# Patient Record
Sex: Female | Born: 1935 | Race: White | Hispanic: No
Health system: Southern US, Community
[De-identification: ages and names within clinical notes are randomized; demographics above are authoritative.]

---

## 2006-05-10 ENCOUNTER — Emergency Department: Payer: Self-pay | Admitting: Emergency Medicine

## 2006-11-12 ENCOUNTER — Ambulatory Visit: Payer: Self-pay | Admitting: Internal Medicine

## 2006-11-13 ENCOUNTER — Ambulatory Visit: Payer: Self-pay | Admitting: Internal Medicine

## 2006-11-25 ENCOUNTER — Ambulatory Visit: Payer: Self-pay | Admitting: Internal Medicine

## 2007-07-24 ENCOUNTER — Other Ambulatory Visit: Payer: Self-pay

## 2007-07-25 ENCOUNTER — Inpatient Hospital Stay: Payer: Self-pay | Admitting: Internal Medicine

## 2007-08-27 ENCOUNTER — Inpatient Hospital Stay: Payer: Self-pay | Admitting: Specialist

## 2007-08-27 ENCOUNTER — Other Ambulatory Visit: Payer: Self-pay

## 2007-12-10 ENCOUNTER — Other Ambulatory Visit: Payer: Self-pay

## 2007-12-10 ENCOUNTER — Inpatient Hospital Stay: Payer: Self-pay | Admitting: Internal Medicine

## 2009-04-11 ENCOUNTER — Inpatient Hospital Stay: Payer: Self-pay | Admitting: Internal Medicine

## 2009-10-07 ENCOUNTER — Inpatient Hospital Stay: Payer: Self-pay | Admitting: Internal Medicine

## 2010-03-12 ENCOUNTER — Emergency Department: Payer: Self-pay | Admitting: Emergency Medicine

## 2010-03-18 ENCOUNTER — Emergency Department: Payer: Self-pay | Admitting: Emergency Medicine

## 2011-04-02 ENCOUNTER — Emergency Department: Payer: Self-pay | Admitting: Emergency Medicine

## 2012-02-22 ENCOUNTER — Ambulatory Visit: Payer: Self-pay | Admitting: Internal Medicine

## 2012-03-13 LAB — CBC WITH DIFFERENTIAL/PLATELET
Basophil #: 0 10*3/uL (ref 0.0–0.1)
Basophil %: 0.5 %
Eosinophil %: 5 %
HCT: 32.9 % — ABNORMAL LOW (ref 35.0–47.0)
HGB: 10.9 g/dL — ABNORMAL LOW (ref 12.0–16.0)
Lymphocyte #: 2.1 10*3/uL (ref 1.0–3.6)
Lymphocyte %: 26.5 %
MCH: 29.8 pg (ref 26.0–34.0)
MCV: 90 fL (ref 80–100)
Monocyte %: 6.5 %
Neutrophil #: 4.8 10*3/uL (ref 1.4–6.5)
Platelet: 259 10*3/uL (ref 150–440)
RBC: 3.66 10*6/uL — ABNORMAL LOW (ref 3.80–5.20)
RDW: 15.4 % — ABNORMAL HIGH (ref 11.5–14.5)
WBC: 7.8 10*3/uL (ref 3.6–11.0)

## 2012-03-13 LAB — URINALYSIS, COMPLETE
Bacteria: NONE SEEN
Bilirubin,UR: NEGATIVE
Glucose,UR: NEGATIVE mg/dL (ref 0–75)
Hyaline Cast: 4
Ketone: NEGATIVE
RBC,UR: <1 /HPF (ref 0–5)
Squamous Epithelial: <1
WBC UR: 1 /HPF (ref 0–5)

## 2012-03-13 LAB — CK TOTAL AND CKMB (NOT AT ARMC)
CK, Total: 53 U/L (ref 21–215)
CK-MB: 1.4 ng/mL (ref 0.5–3.6)

## 2012-03-13 LAB — COMPREHENSIVE METABOLIC PANEL
Albumin: 4 g/dL (ref 3.4–5.0)
Alkaline Phosphatase: 79 U/L (ref 50–136)
Anion Gap: 7 (ref 7–16)
Bilirubin,Total: 0.2 mg/dL (ref 0.2–1.0)
Calcium, Total: 9.1 mg/dL (ref 8.5–10.1)
Creatinine: 1.08 mg/dL (ref 0.60–1.30)
EGFR (Non-African Amer.): 44 — ABNORMAL LOW
Glucose: 166 mg/dL — ABNORMAL HIGH (ref 65–99)
Osmolality: 272 (ref 275–301)
Potassium: 4.6 mmol/L (ref 3.5–5.1)
SGOT(AST): 22 U/L (ref 15–37)
Sodium: 133 mmol/L — ABNORMAL LOW (ref 136–145)

## 2012-03-13 LAB — TROPONIN I: Troponin-I: <0.02 ng/mL

## 2012-03-14 ENCOUNTER — Inpatient Hospital Stay: Payer: Self-pay | Admitting: Family Medicine

## 2012-03-14 LAB — DRUG SCREEN, URINE
Amphetamines, Ur Screen: NEGATIVE (ref ?–1000)
Barbiturates, Ur Screen: NEGATIVE (ref ?–200)
Benzodiazepine, Ur Scrn: NEGATIVE (ref ?–200)
MDMA (Ecstasy)Ur Screen: NEGATIVE (ref ?–500)
Phencyclidine (PCP) Ur S: NEGATIVE (ref ?–25)
Tricyclic, Ur Screen: NEGATIVE (ref ?–1000)

## 2012-03-14 LAB — LIPID PANEL
Cholesterol: 143 mg/dL (ref 0–200)
HDL Cholesterol: 44 mg/dL (ref 40–60)
Triglycerides: 74 mg/dL (ref 0–200)

## 2012-03-14 LAB — AMMONIA: Ammonia, Plasma: <25 mcmol/L (ref 11–32)

## 2012-03-14 LAB — CK TOTAL AND CKMB (NOT AT ARMC): CK, Total: 41 U/L (ref 21–215)

## 2012-03-14 LAB — TROPONIN I: Troponin-I: <0.02 ng/mL

## 2012-03-15 LAB — BASIC METABOLIC PANEL
Anion Gap: 6 — ABNORMAL LOW (ref 7–16)
BUN: 13 mg/dL (ref 7–18)
Chloride: 108 mmol/L — ABNORMAL HIGH (ref 98–107)
Co2: 25 mmol/L (ref 21–32)
Creatinine: 0.89 mg/dL (ref 0.60–1.30)
EGFR (African American): >60
Potassium: 4.1 mmol/L (ref 3.5–5.1)

## 2012-03-15 LAB — CBC WITH DIFFERENTIAL/PLATELET
Eosinophil #: 0.1 10*3/uL (ref 0.0–0.7)
Eosinophil %: 1.8 %
HCT: 28.7 % — ABNORMAL LOW (ref 35.0–47.0)
Lymphocyte #: 1.4 10*3/uL (ref 1.0–3.6)
MCH: 29.6 pg (ref 26.0–34.0)
MCHC: 33.4 g/dL (ref 32.0–36.0)
MCV: 89 fL (ref 80–100)
Monocyte #: 0.5 x10 3/mm (ref 0.2–0.9)
Neutrophil %: 70.8 %
Platelet: 225 10*3/uL (ref 150–440)
RDW: 15 % — ABNORMAL HIGH (ref 11.5–14.5)
WBC: 6.8 10*3/uL (ref 3.6–11.0)

## 2012-03-17 LAB — CBC WITH DIFFERENTIAL/PLATELET
Basophil %: 0.3 %
Eosinophil #: 0.2 10*3/uL (ref 0.0–0.7)
Eosinophil %: 3.1 %
HCT: 29.9 % — ABNORMAL LOW (ref 35.0–47.0)
Lymphocyte #: 1.5 10*3/uL (ref 1.0–3.6)
Lymphocyte %: 19 %
MCH: 31.1 pg (ref 26.0–34.0)
MCV: 89 fL (ref 80–100)
Monocyte %: 9.9 %
Platelet: 249 10*3/uL (ref 150–440)
RBC: 3.37 10*6/uL — ABNORMAL LOW (ref 3.80–5.20)
WBC: 7.9 10*3/uL (ref 3.6–11.0)

## 2012-03-17 LAB — BASIC METABOLIC PANEL
Anion Gap: 8 (ref 7–16)
Calcium, Total: 8.8 mg/dL (ref 8.5–10.1)
Chloride: 109 mmol/L — ABNORMAL HIGH (ref 98–107)
Co2: 25 mmol/L (ref 21–32)
Creatinine: 0.76 mg/dL (ref 0.60–1.30)
EGFR (African American): >60
Potassium: 3.1 mmol/L — ABNORMAL LOW (ref 3.5–5.1)

## 2012-03-19 LAB — CULTURE, BLOOD (SINGLE)

## 2012-03-21 LAB — CREATININE, SERUM
Creatinine: 0.72 mg/dL (ref 0.60–1.30)
EGFR (African American): >60
EGFR (Non-African Amer.): >60

## 2012-03-23 ENCOUNTER — Ambulatory Visit: Payer: Self-pay | Admitting: Internal Medicine

## 2013-01-06 ENCOUNTER — Inpatient Hospital Stay: Payer: Self-pay | Admitting: Internal Medicine

## 2013-01-06 LAB — CBC WITH DIFFERENTIAL/PLATELET
Basophil #: 0 10*3/uL (ref 0.0–0.1)
Eosinophil #: 0.2 10*3/uL (ref 0.0–0.7)
HCT: 33.6 % — ABNORMAL LOW (ref 35.0–47.0)
Lymphocyte #: 1.3 10*3/uL (ref 1.0–3.6)
Lymphocyte %: 21 %
MCHC: 33.6 g/dL (ref 32.0–36.0)
MCV: 90 fL (ref 80–100)
Monocyte #: 0.6 x10 3/mm (ref 0.2–0.9)
Neutrophil #: 4 10*3/uL (ref 1.4–6.5)
Platelet: 191 10*3/uL (ref 150–440)
RDW: 14.6 % — ABNORMAL HIGH (ref 11.5–14.5)

## 2013-01-06 LAB — URINALYSIS, COMPLETE
Glucose,UR: NEGATIVE mg/dL (ref 0–75)
Hyaline Cast: 7
Ketone: NEGATIVE
Nitrite: NEGATIVE
Ph: 6 (ref 4.5–8.0)
Protein: NEGATIVE
Specific Gravity: 1.01 (ref 1.003–1.030)
Squamous Epithelial: 3
WBC UR: 25 /HPF (ref 0–5)

## 2013-01-06 LAB — PROTIME-INR
INR: 1
Prothrombin Time: 13.7 secs (ref 11.5–14.7)

## 2013-01-06 LAB — COMPREHENSIVE METABOLIC PANEL
Albumin: 3 g/dL — ABNORMAL LOW (ref 3.4–5.0)
Alkaline Phosphatase: 78 U/L (ref 50–136)
Anion Gap: 6 — ABNORMAL LOW (ref 7–16)
BUN: 39 mg/dL — ABNORMAL HIGH (ref 7–18)
Calcium, Total: 8.9 mg/dL (ref 8.5–10.1)
Chloride: 106 mmol/L (ref 98–107)
EGFR (Non-African Amer.): 28 — ABNORMAL LOW
Glucose: 119 mg/dL — ABNORMAL HIGH (ref 65–99)
Osmolality: 288 (ref 275–301)
Potassium: 4.4 mmol/L (ref 3.5–5.1)
SGOT(AST): 23 U/L (ref 15–37)
SGPT (ALT): 21 U/L (ref 12–78)
Sodium: 139 mmol/L (ref 136–145)
Total Protein: 6.1 g/dL — ABNORMAL LOW (ref 6.4–8.2)

## 2013-01-08 LAB — BASIC METABOLIC PANEL
BUN: 25 mg/dL — ABNORMAL HIGH (ref 7–18)
Calcium, Total: 8.8 mg/dL (ref 8.5–10.1)
Chloride: 107 mmol/L (ref 98–107)
Creatinine: 0.96 mg/dL (ref 0.60–1.30)
EGFR (African American): 58 — ABNORMAL LOW
EGFR (Non-African Amer.): 50 — ABNORMAL LOW
Glucose: 122 mg/dL — ABNORMAL HIGH (ref 65–99)
Osmolality: 281 (ref 275–301)
Potassium: 3.8 mmol/L (ref 3.5–5.1)
Sodium: 138 mmol/L (ref 136–145)

## 2013-01-08 LAB — CBC WITH DIFFERENTIAL/PLATELET
Basophil #: 0 10*3/uL (ref 0.0–0.1)
Basophil %: 0.4 %
Eosinophil %: 5.8 %
HGB: 10.1 g/dL — ABNORMAL LOW (ref 12.0–16.0)
Lymphocyte #: 1.6 10*3/uL (ref 1.0–3.6)
Lymphocyte %: 26 %
MCHC: 34.5 g/dL (ref 32.0–36.0)
MCV: 89 fL (ref 80–100)
Monocyte #: 0.5 x10 3/mm (ref 0.2–0.9)
Neutrophil #: 3.7 10*3/uL (ref 1.4–6.5)
Platelet: 191 10*3/uL (ref 150–440)
RBC: 3.27 10*6/uL — ABNORMAL LOW (ref 3.80–5.20)
RDW: 13.3 % (ref 11.5–14.5)

## 2013-01-09 LAB — BASIC METABOLIC PANEL
BUN: 11 mg/dL (ref 7–18)
Chloride: 108 mmol/L — ABNORMAL HIGH (ref 98–107)
Co2: 25 mmol/L (ref 21–32)
Creatinine: 0.9 mg/dL (ref 0.60–1.30)
EGFR (African American): >60
EGFR (Non-African Amer.): 54 — ABNORMAL LOW
Glucose: 139 mg/dL — ABNORMAL HIGH (ref 65–99)
Potassium: 3.5 mmol/L (ref 3.5–5.1)
Sodium: 141 mmol/L (ref 136–145)

## 2013-03-23 DEATH — deceased

## 2013-10-09 IMAGING — CT CT HEAD WITHOUT CONTRAST
2 series · 16 of 30 positions shown, 20 images · non-contrast
Comparison: none

REASON FOR EXAM: unresponsive
COMMENTS:

PROCEDURE:     CT  - CT HEAD WITHOUT CONTRAST  - March 13, 2012 [DATE]
RESULT:     Comparison:  None
TECHNIQUE: Multiple axial images from the foramen magnum to the vertex were
obtained without IV contrast.

[Series 2: without · axial · non-contrast · 0.43mm/px · z∈[+16,+146]mm · 13 of 32 slices shown, 17 images]
[im 3/32  brain]
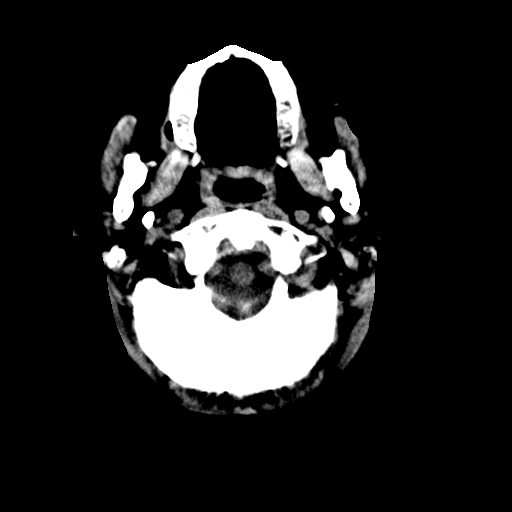
[im 3/32  bone]
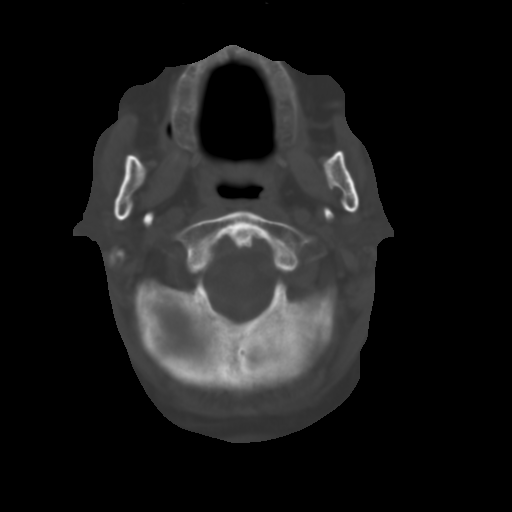
[im 5/32  brain]
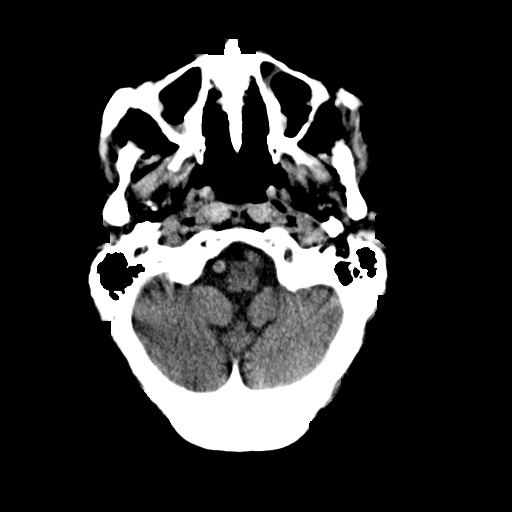
[im 7/32  brain]
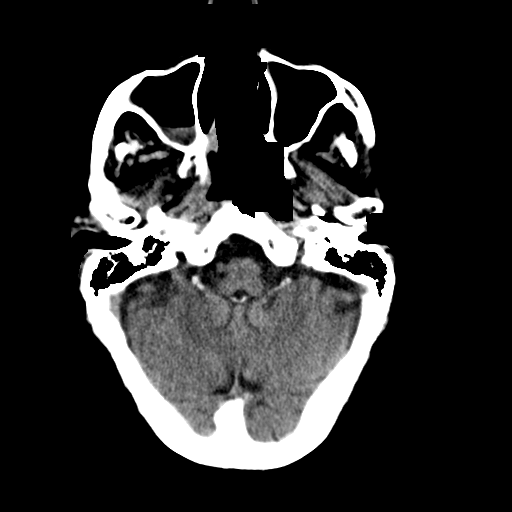
[im 9/32  brain]
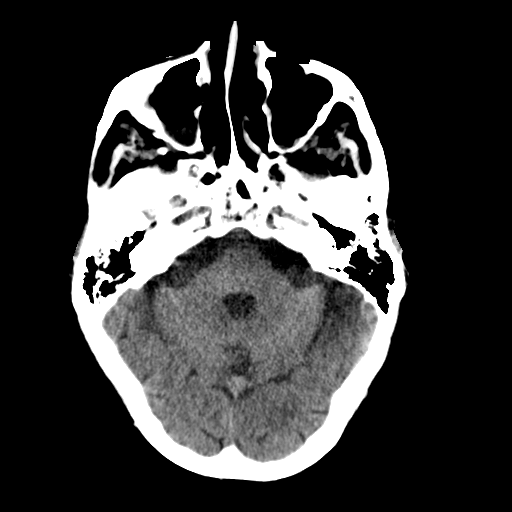
[im 12/32  brain]
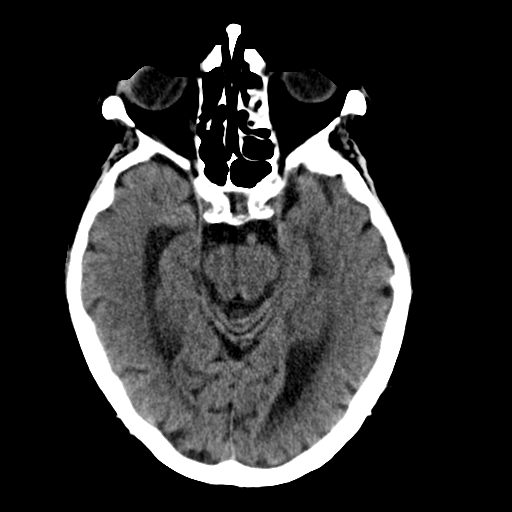
[im 12/32  bone]
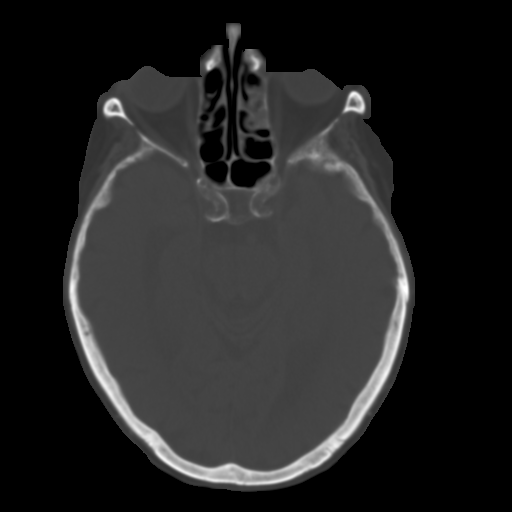
[im 14/32  brain]
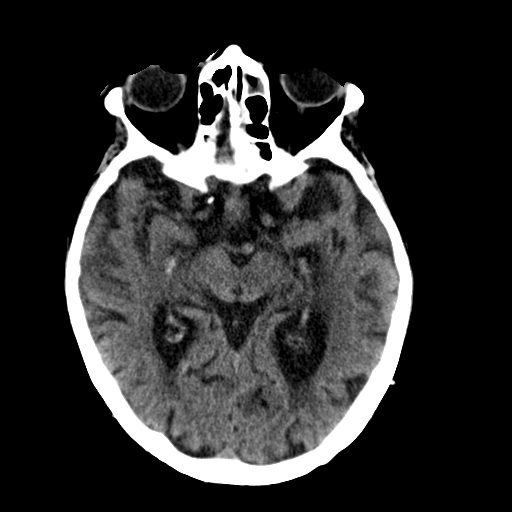
[im 16/32  brain]
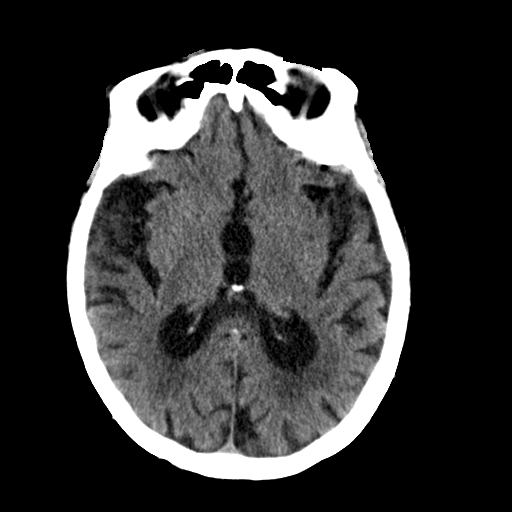
[im 18/32  brain]
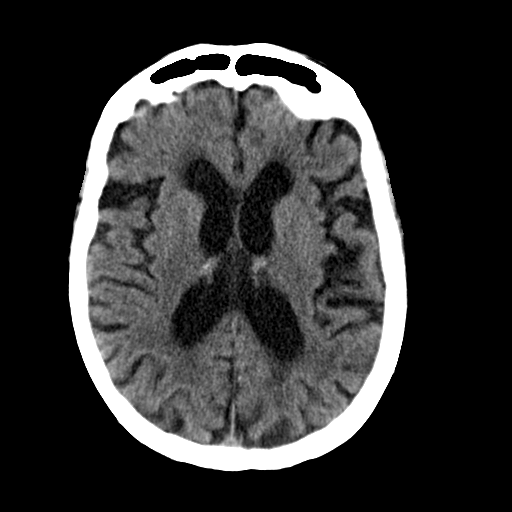
[im 20/32  brain]
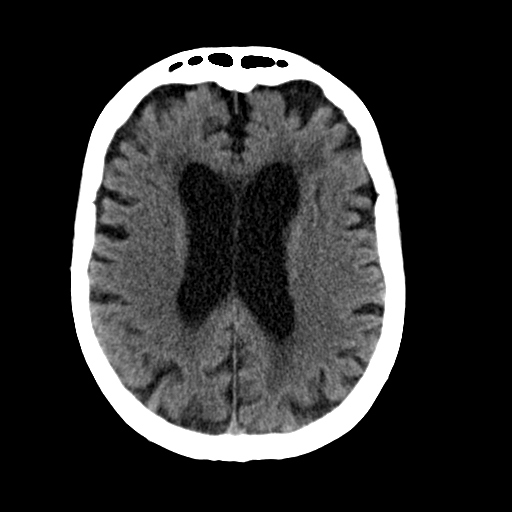
[im 20/32  bone]
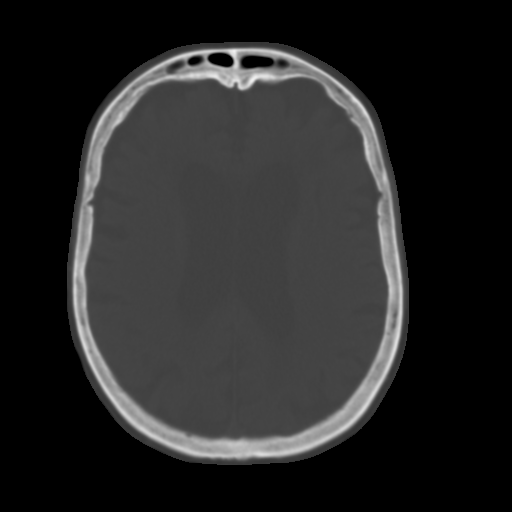
[im 23/32  brain]
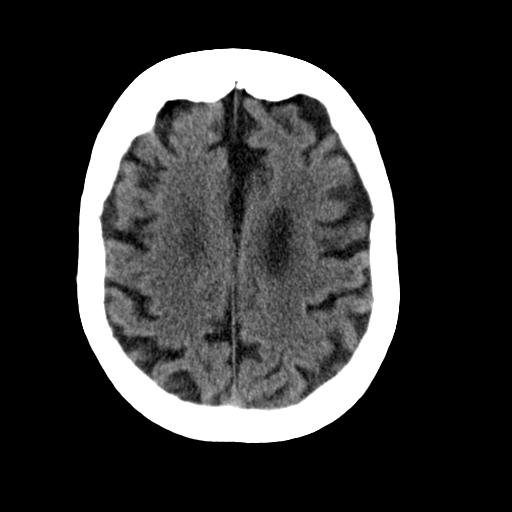
[im 25/32  brain]
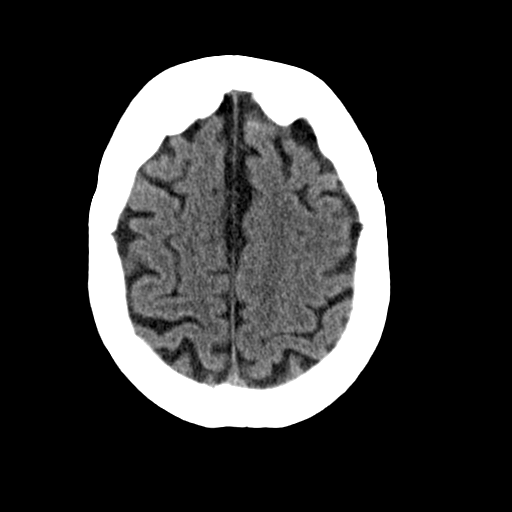
[im 27/32  brain]
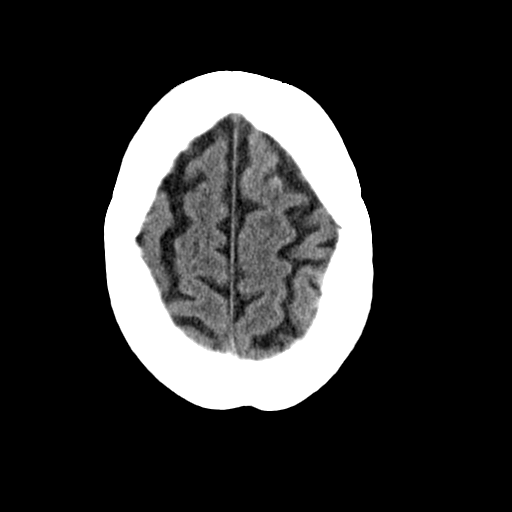
[im 29/32  brain]
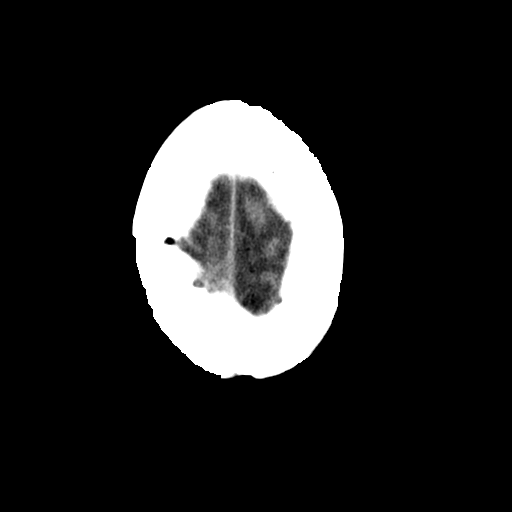
[im 29/32  bone]
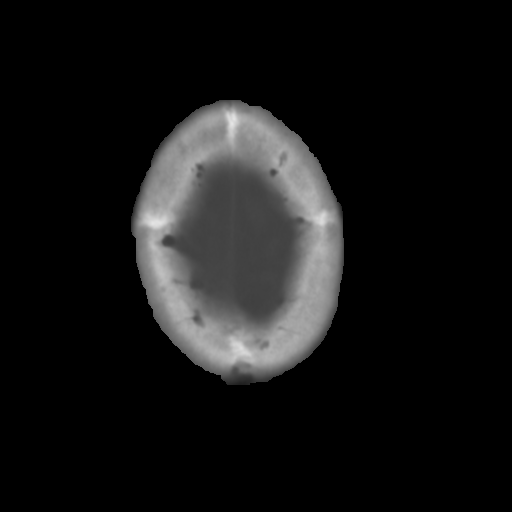

[Series 3: bone · axial · 0.43mm/px · z∈[+16,+61]mm · 3 of 32 slices shown]
[im 3/32  bone]
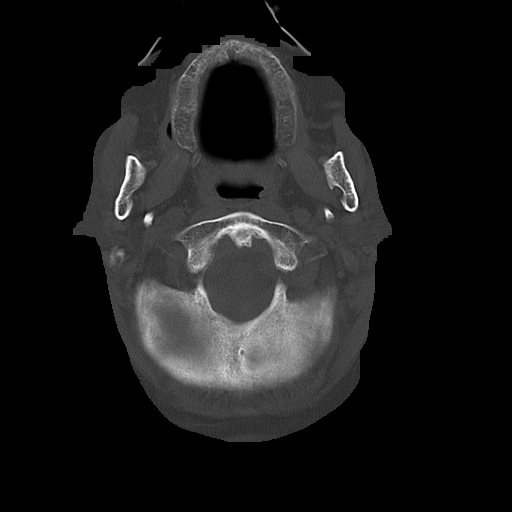
[im 7/32  bone]
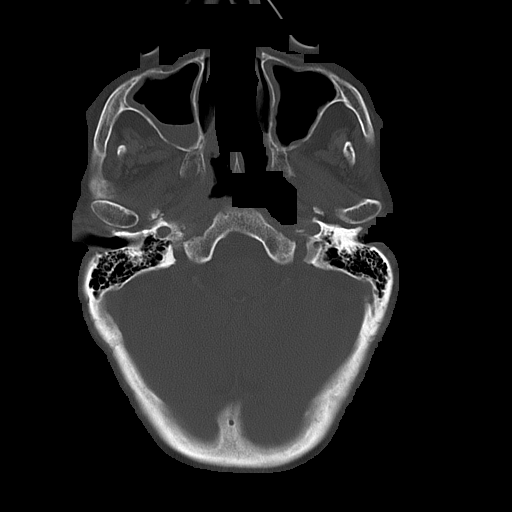
[im 12/32  bone]
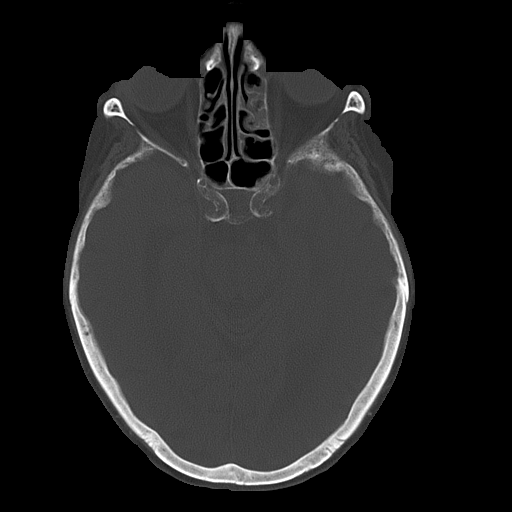

[16 of 30 positions shown; findings below may reference images not displayed]

FINDINGS: There is no evidence for mass effect, midline shift, or extra-axial fluid
collections. There is no evidence for space-occupying lesion, intracranial
hemorrhage, or cortical-based area of infarction. Periventricular and
subcortical hypoattenuation is consistent with chronic small vessel ischemic
disease. There is a small air-fluid level in right maxillary sinus. There is
mild mucous thickening of the left maxillary sinus and the ethmoid air cells.

The osseous structures are unremarkable.
IMPRESSION: 1. No acute intracranial process.
2. Chronic small vessel ischemic disease.
3. Paranasal sinus disease.

## 2013-10-09 IMAGING — CR DG CHEST 1V PORT
1 series · 1 of 1 positions shown · non-contrast
Comparison: none

REASON FOR EXAM: unresponsive
COMMENTS:

PROCEDURE:     DXR - DXR PORTABLE CHEST SINGLE VIEW  - March 13, 2012  [DATE]
RESULT:     Comparison: 04/02/2011

[ap]
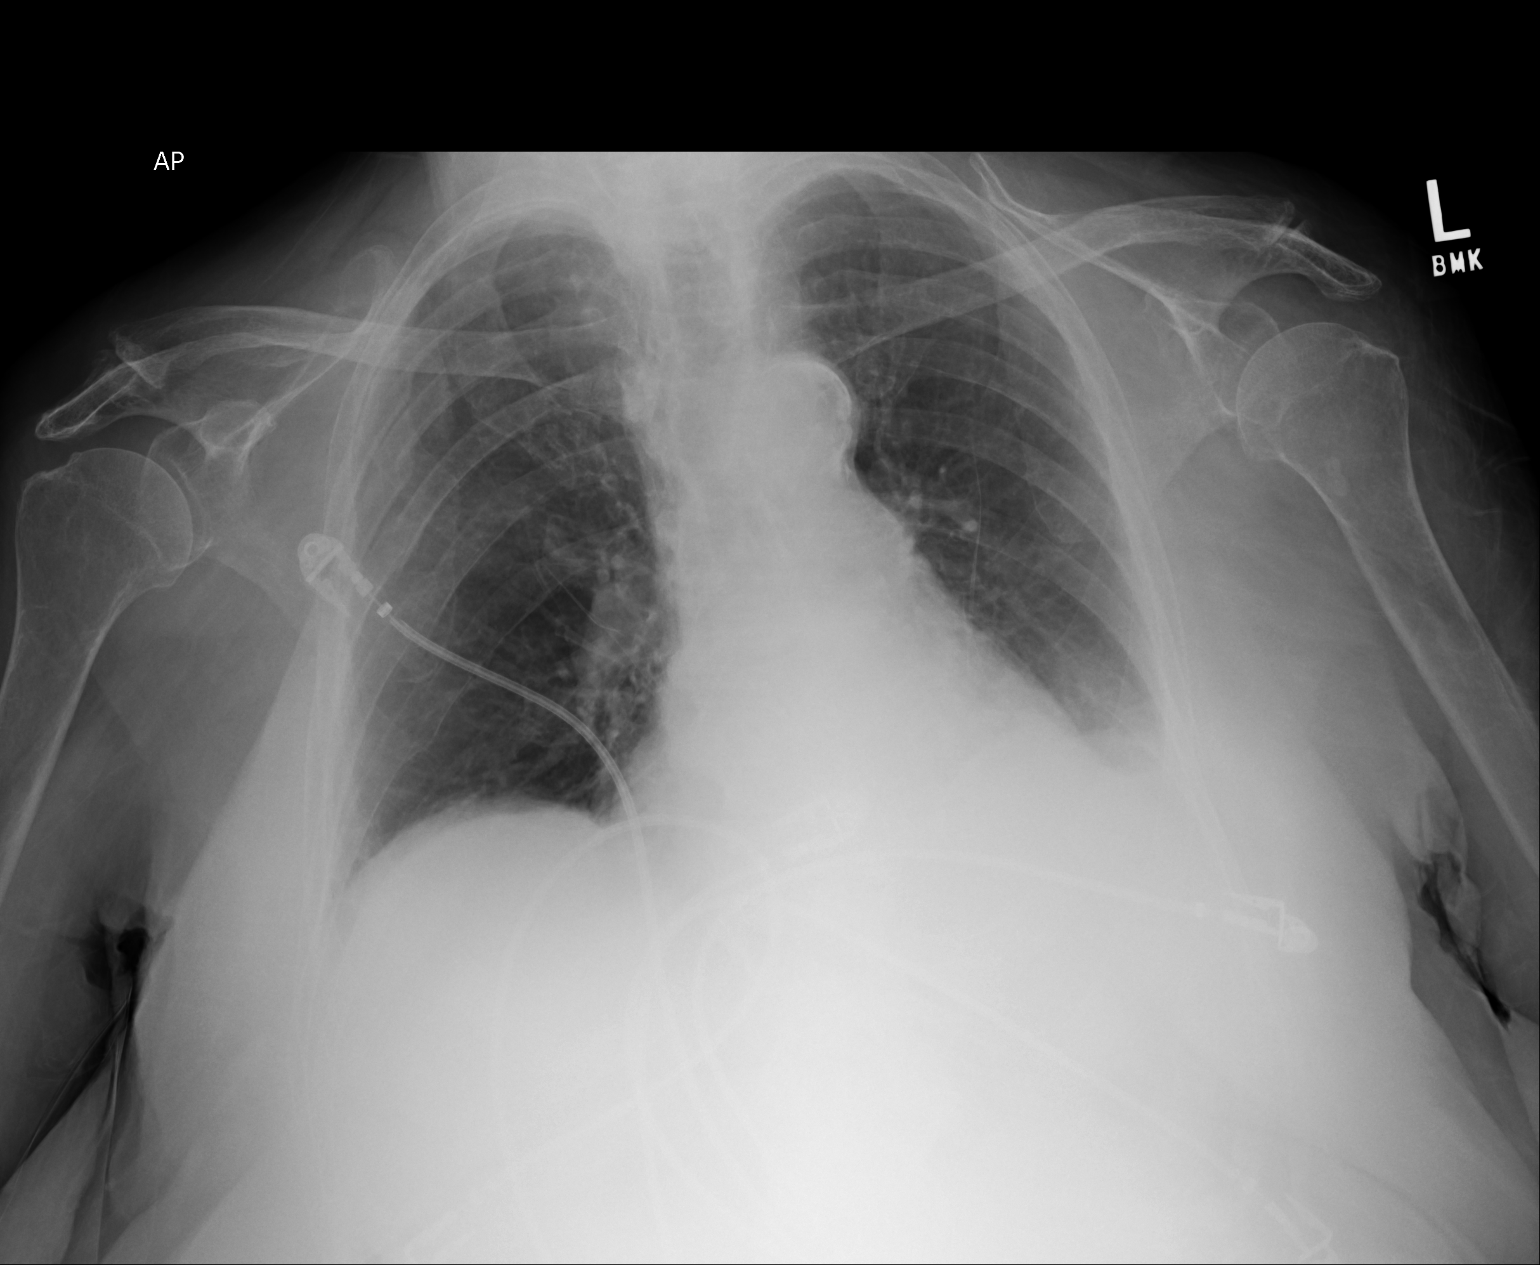

[1 of 1 positions shown; findings below may reference images not displayed]

FINDINGS: The heart and mediastinum are stable. The lung volumes are slightly
diminished. There is mild heterogeneous opacity at the left lung base. There
are old right posterior lateral rib fractures.
IMPRESSION: Mild heterogeneous opacities at the left lung base may represent atelectasis
or infection. Followup PA and lateral chest radiograph is recommended.

[REDACTED]

## 2013-10-13 IMAGING — CR DG CHEST 1V PORT
1 series · 1 of 1 positions shown · non-contrast
Comparison: none

REASON FOR EXAM: follow up pneumonia
COMMENTS:

[ap]
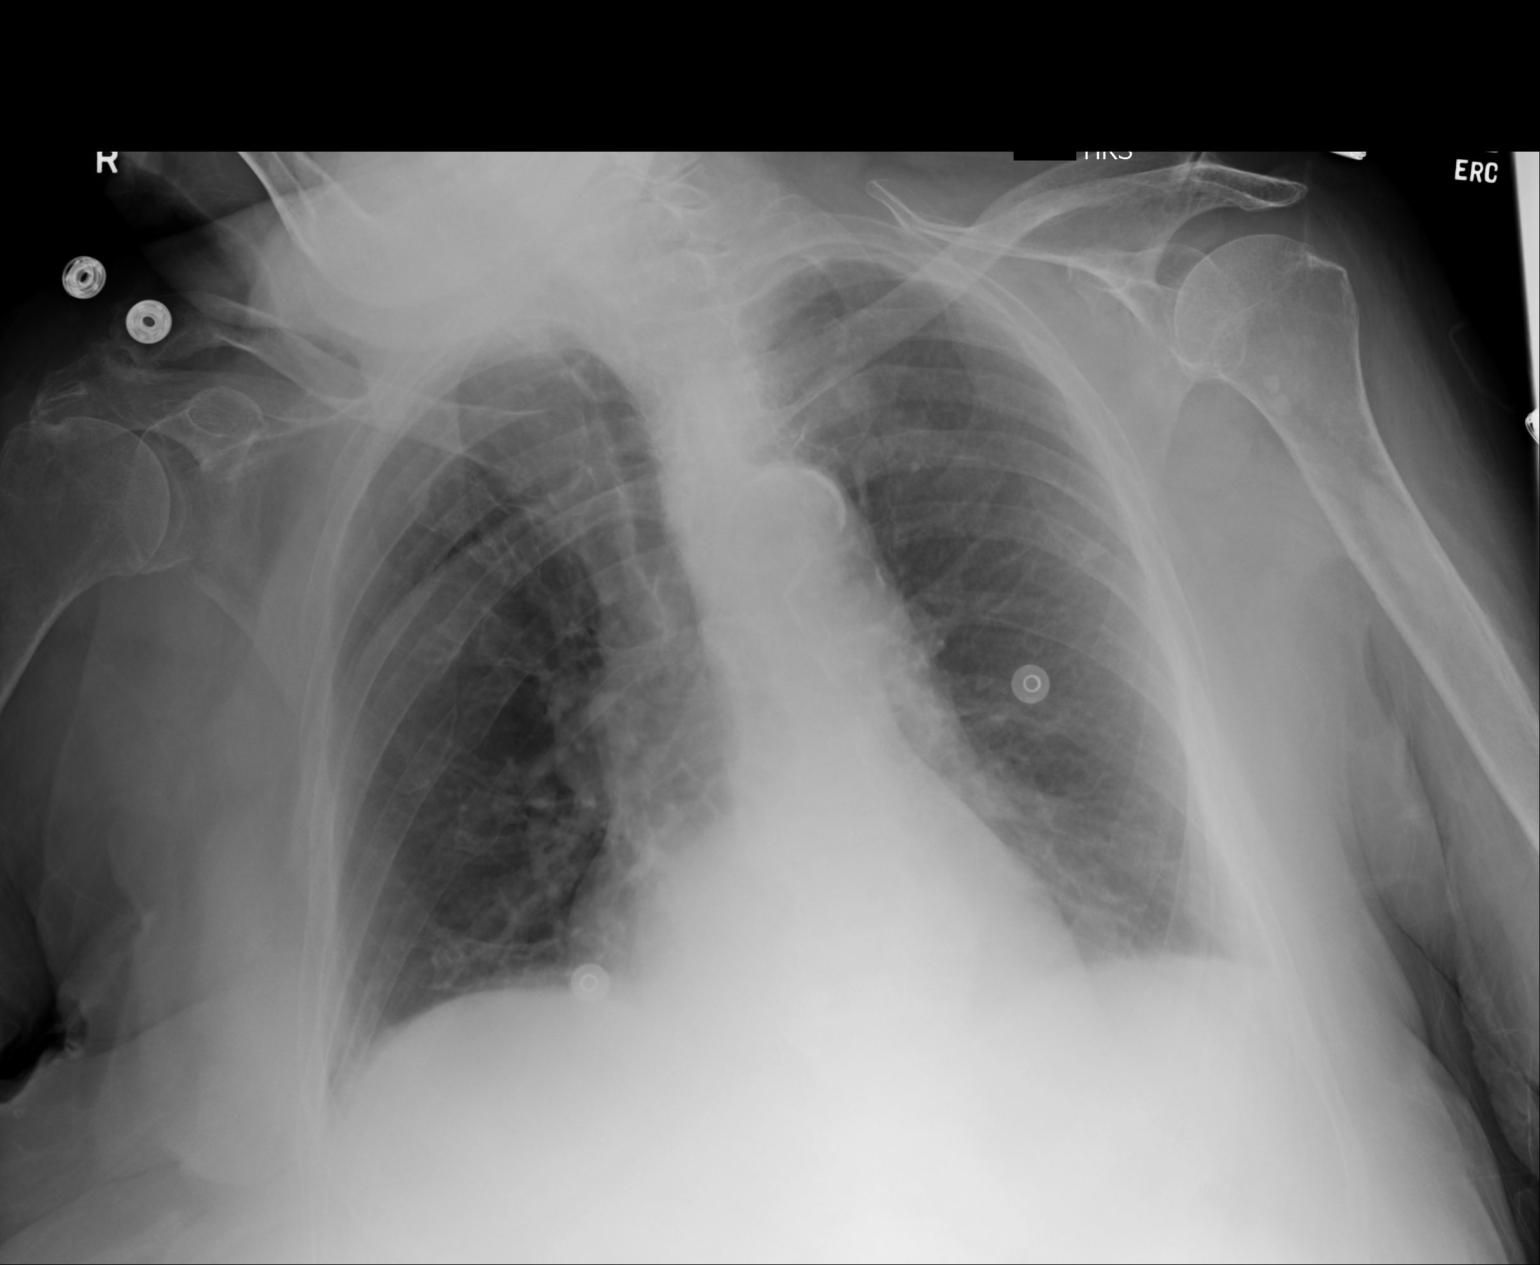

[1 of 1 positions shown; findings below may reference images not displayed]

PROCEDURE:     DXR - DXR PORTABLE CHEST SINGLE VIEW  - March 17, 2012  [DATE]

RESULT:     Comparison is made to the study March 13, 2012.

The patient's positioning is limited due to her clinical condition. The
lungs are reasonably well inflated. There is no focal infiltrate. The
cardiac silhouette is not enlarged. There is likely a hiatal hernia present.
There is no pleural effusion or alveolar infiltrate.
IMPRESSION: There is no definite evidence of pneumonia nor CHF nor
other acute cardiopulmonary abnormality. The left hemidiaphragm is better
demonstrated today.

[REDACTED]

## 2014-08-10 NOTE — Discharge Summary (Signed)
PATIENT NAME:  Amanda Tate, Amanda Tate MR#:  [id number] DATE OF BIRTH:  1914-12-02  DATE OF ADMISSION:  03/14/2012 DATE OF DISCHARGE:  03/21/2012  REASON FOR ADMISSION: Unresponsiveness.   DISCHARGE DIAGNOSES: 1. Altered mental status likely due to infection.  2. Pneumonia.  3. Hypotension likely due to systemic inflammatory response syndrome with sepsis.  4. Acute respiratory failure requiring nonrebreather mask.  5. Left bundle branch block.  6. History of paroxysmal atrial fibrillation, rate controlled.  7. Dementia.  8. Delirium.  9. Gastroesophageal reflux disease. 10. Chronic obstructive pulmonary disease.  11. Type 2 diabetes non-insulin-dependent.  12. Hypertension.  13. Coronary artery disease.  14. Hypothyroidism.  15. Obesity.  16. History of breast cancer.  17. Shingles.  18. Osteopenia.  19. Diffuse myalgias.  20. Anemia of chronic disease.   DISPOSITION: Skilled nursing facility.   CONDITION AT DISCHARGE: Stable.   ACTIVITY: As tolerated.  DISCHARGE MEDICATIONS: 1. Cozaar 100 mg once a day.  2. Synthroid 75 mcg once a day. 3. Glipizide 5 mg once a day before breakfast. 4. Nexium 40 mg once a day. 5. Norvasc 10 mg once a day. 6. Aspirin 81 mg once a day. 7. Ferrous sulfate 325 mg twice daily.  8. Tylenol extra-strength p.r.n. pain.  9. Zoloft 100 mg oral tablet once a day.  10. Albuterol 2.5 mg inhaled p.r.n. shortness of breath. 11. Ativan 0.5 mg oral tablet twice a day as needed. 12. Olanzapine 5 mg oral tablet once a day. 13. Quetiapine 25 mg orally once at bedtime. 14. Nystatin topical cream as needed. 15. Ensure one can three times daily in [employer]. 16. Glucophage 1000 mg once daily.  HOSPITAL COURSE: Amanda Tate is a very nice 81 year old female who has history of significant dementia, coronary artery disease, diabetes type 2, prior history of aspiration pneumonia, gastroesophageal reflux disease, hypothyroidism, and prior history of breast cancer who was  admitted on 03/14/2012 due to changes in her mental status. Apparently, by the patient's daughter history, she was in her usual state of mind until the evening of the day of admission, around 7:30, she was resting comfortably but while she was at the nursing home church she slumped over and became unresponsive. She did not have any symptoms prior to that, no other complaints like dizziness, chest pain, or shortness of breath for what EMS was called. She was brought to the ER. At that point, she had a CAT scan of the head that had no acute findings, no bleeding. The patient did not regain full consciousness and during the admission she was noted to have pinpoint pupils. She had some increased labored breathing for what she was put on nonrebreather. Her oxygen saturations were decreased, but on nonrebreather she went up to 100%. She had some gases that showed a pH of 7.31, pCO2 49, and pO2 250 on 100% FiO2. The patient was admitted for septic shock. Her blood pressure was 82/40 and her heart rate was 93. Respiratory rate was in between 20 and 36 for which the patient was started actively on antibiotics to treat pneumonia, with Levaquin and Zosyn, to treated for facility or healthcare-acquired pneumonia. The patient started to get better with IV fluids and started to wake up. Unfortunately, during all this time, the patient had become very severely agitated and confused with delirium for what the patient needed to stay longer in the hospital. Her treatment with the antibiotics concluded and the patient has clearance of her pneumonia. Overall she is much better.  I had a long discussion with her daughter. Since she becomes so agitated and aggressive, we have started olanzapine and Seroquel and she has responded very well to these medications. The patient is going back with the same medications she was having prior to that and continued treatment for her diabetes with glipizide and metformin, although we are not going to  stick with severe recommendations for diet. She can have regular diet if she wants, she can have Ensure, and she can have Magic Cups.   DISPOSITION: Skilled nursing facility.  TIME SPENT ON DISCHARGE: Approximately 40 minutes. ____________________________ Felipa Furnace, MD rsg:slb D: 03/21/2012 13:48:23 ET T: 03/21/2012 14:01:11 ET JOB#: [id number]  cc: Felipa Furnace, MD, <Dictator> Linder Prajapati Juanda Chance MD ELECTRONICALLY SIGNED 03/23/2012 22:59

## 2014-08-10 NOTE — H&P (Signed)
PATIENT NAME:  Amanda Tate, Amanda Tate MR#:  [id number] DATE OF BIRTH:  [birth year]  DATE OF ADMISSION:  03/14/2012  PRIMARY CARE PHYSICIAN: Corliss Parish, MD   CHIEF COMPLAINT: Unresponsiveness.   HISTORY OF PRESENT ILLNESS: The patient is a 81 year old Caucasian female with a past medical history of chronic dementia, wheelchair bound, COPD, chronic respiratory failure on intermittent oxygen at nursing home, history of diabetes mellitus type II, hypertension, coronary artery disease, and old history of left bundle branch block from the year 2012, and hypothyroidism who was brought into the ER after she became unresponsive suddenly. According to the patient's daughter, she was in her usual state of health until late evening at around 7:30 p.m. Daughter had a conversation with the patient this afternoon and the patient wanted to go home to rest comfortably as reported by the patient's daughter. At around 7:30 p.m. while she was at nursing home church, she suddenly slumped over and became unresponsive. She did not have any symptoms prior to that. She did not complain of any dizziness, chest pain, or shortness of breath prior to this episode. EMS was called and the patient was brought into the ER. She had pinpoint pupils which were sluggishly responding. CAT scan of the head was done which showed no acute findings, no acute bleeds. The patient did not regain her consciousness and she was persistently unresponsive with pinpoint pupils. As the patient had labored breathing, she was placed on a nonrebreather. Chest x-ray has showed atelectasis and obliterated costophrenic angle on the left side and some pulmonary edema. Blood cultures were obtained and the patient was given IV Rocephin. Cardiac enzymes were negative. The patient's initial blood gas which was done on November 21st at 21:00 has revealed pH of 7.31, pCO2 49, and pO2 250 on 100% FiO2. Eventually repeated blood gases revealed a pH of 7.27, pCO2 of 55, and pO2 141 on  nonrebreather. The patient has DO NOT RESUSCITATE CODE STATUS and daughter, who is medical power-of-attorney at bedside, wants to continue DO NOT RESUSCITATE CODE STATUS. She is aware of her mom's critical situation but as she is elderly and has a DO NOT RESUSCITATE CODE STATUS she wants to respect her mom's wishes and she is also leaning towards COMFORT CARE measures if the patient doesn't clinically improve. During my examination the patient was on nonrebreather, unarousable with pinpoint pupils. Initially the patient's blood pressure was stable. Initially she was hypotensive and fluid bolus was given in the ER. Subsequently, the patient's blood pressure was at 83/40 and eventually went up to 89/44 with fluid bolus.   The patient has skin lesion in the right eye corner regarding which she was evaluated by dermatologist recently who has recommended the patient to be seen by plastic surgeon as it is close to her right eye as reported by the patient's daughter.   PAST MEDICAL HISTORY:  1. COPD with chronic respiratory failure, on intermittent oxygen at nursing home.   2. Gastroesophageal reflux disease.  3. History of aspiration pneumonia. 4. Diabetes mellitus, type II, not on insulin. 5. Hypertension. 6. Coronary artery disease with old left bundle branch block as per EKG from December 2012.  7. Hypothyroidism. 8. Obesity. 9. Chronic history of dementia, wheelchair bound.  10. Breast cancer. 11. Shingles. 12. Osteopenia. 13. Diffuse myalgias. 14. Paroxysmal atrial fibrillation.   PAST SURGICAL HISTORY:  1. Left hip fracture, status post repair in May 2009. 2. Total abdominal hysterectomy and bilateral salpingo-oophorectomy. 3. Cholecystectomy. 4. Appendectomy.   HOME MEDICATIONS:  1. Zoloft 100 mg p.o. once a day.  2. Tylenol 1 tablet p.o. 2 times a day. 3. Synthroid 75 mcg once a day.  4. Norvasc 10 mg once a day.  5. Nexium 40 mg once a day.  6. Glucophage 500 mg 2 tablets once a  day.  7. Glipizide 5 mg once daily. 8. Ferrous sulfate 325 mg 2 times a day.  9. Cozaar 100 mg 1 tablet once a day. 10. Ativan 0.5 mg twice a day. 11. Aspirin 81 mg once a day.  12. Albuterol inhalation on as needed basis.   PSYCHOSOCIAL HISTORY: From old history and physical the patient was a former smoker. No history of alcohol or recreational drugs. She resides in a nursing home.   ALLERGIES: She is allergic to beta-blockers, calcium channel blockers, and codeine.  FAMILY HISTORY:  Colon cancer and coronary artery disease runs in her family.  REVIEW OF SYSTEMS: Unobtainable as the patient is unresponsive   PHYSICAL EXAMINATION:   VITAL SIGNS: Temperature 98.6, pulse 93, respiratory rate fluctuating between 20 to 36, blood pressure initially 117/52, subsequently it was at 83/40 at 23:07 on November 21st, pulse oximetry 100% on nonrebreather.   GENERAL APPEARANCE: Not in acute distress, unresponsive on nonrebreather.   HEENT: Normocephalic, atraumatic. Pinpoint pupils sluggishly responding to light. Moist mucous membranes. No pharyngeal edema is noted. No scleral icterus.   NECK: Supple. No JVD.   LUNGS: Coarse breath sounds. Moderate air entry. No wheezing. Positive rhonchi.   CARDIOVASCULAR: Regular rate and rhythm. No gallops. No rubs.   GI: Soft. Bowel sounds are hypoactive, nondistended. No hepatosplenomegaly.   NEUROLOGIC: Unresponsive with pinpoint pupils.   EXTREMITIES: No cyanosis. No clubbing. No edema.   MUSCULOSKELETAL: Normal tone. Unable to examine the motor and sensory as the patient is unresponsive.   LABS AND IMAGING STUDIES: 12-lead EKG has revealed a left bundle branch block. No new changes when compared to the old EKG from December 2012.   CAT scan of the head no acute findings.   Chest x-ray positive atelectasis with obliterated costophrenic angle on the left side with a small amount of pleural effusion.   White count 7800, hemoglobin 10.9, hematocrit  32.9, platelet count 259,000, neutrophils 61.5, lymphocytes 26.5, glucose 166, sodium 133, potassium 4.6, chloride 101, CO2 25, BUN 18, creatinine 1.08, total bilirubin 0.2, alkaline phosphatase 79, ALT 20, AST 22, total protein 7.6, osmolality 272, GFR 44. Total CK 53. CK-MB 1.4. Troponin-I less than 0.02. Accu-Chek 169. Initial ABG showed pH of 7.31, pCO2 49, pO2 250 on 100% FiO2. Urinalysis negative glucose, negative bilirubin, ketones negative, pH 6.0, nitrite and leukocyte esterase negative. Repeat ABG at 23:48 revealed pH of 7.27, pCO2 55, pO2 141, FiO2 100%. Urine drug screen is ordered which is pending. Serum ammonia level is pending.   ASSESSMENT AND PLAN: This is a 81 year old Caucasian female brought in from the nursing home after she became unresponsive suddenly. She will be admitted with the following assessment and plan.  1. Altered mental status/unresponsiveness probably from acute stroke versus sepsis. Will admit to Med/Surg floor with telemetry. CT head did not show any acute bleed. Will give her aspirin rectally. Check fasting lipid panel in a.m. Will get MRI of the brain in a.m. if okay with daughter. Will continue her CODE STATUS as DO NOT RESUSCITATE. The patient's daughter is her medical power-of-attorney. She is considering COMFORT CARE if the patient doesn't clinically improve. We have started her on empiric antibiotics with Zosyn and Levaquin  for possible sepsis with aspiration pneumonia. Will continue nonrebreather for acute respiratory failure.  2. Sepsis with hypotension probably from aspiration pneumonia. The patient is on IV fluid. Boluses were given. Will continue IV antibiotics. Blood cultures are pending.  3. Acute respiratory failure probably from aspiration pneumonia. The patient is on nonrebreather. Continue IV antibiotics.  4. Chronic left bundle branch block with negative cardiac enzymes. Will recycle cardiac biomarkers. No interventions needed at this time. Will continue  aspirin.  5. History of paroxysmal atrial fibrillation, rate controlled. Will continue close monitoring and telemetry.   As the patient is DO NOT RESUSCITATE and daughter is considering COMFORT CARE measures, I am not ordering complete stroke work-up at this time. Further management of plan depending on the patient's clinical situation after discussing with the patient's daughter.   CONDITION: Critical.   The plan of care was discussed in detail with the patient's daughter who is also medical power-of-attorney. She verbalized understanding of the plan.   TOTAL CRITICAL CARE TIME SPENT: 60 minutes.   ____________________________ Ramonita Lab, MD [id number] D: 03/14/2012 00:59:20 ET T: 03/14/2012 06:18:05 ET JOB#: [id number]  cc: Ramonita Lab, MD, <Dictator> Corliss Parish, MD Ramonita Lab MD ELECTRONICALLY SIGNED 03/19/2012 6:02

## 2014-08-10 NOTE — Consult Note (Signed)
Referring Physician:  Nicholes Mango :   Primary Care Physician:  [facility] at [facility], [address], [place], [place] [address], [phone]  Reason for Consult:  Admit Date: 14-Mar-2012   Chief Complaint: AMS   Reason for Consult: altered mental status   History of Present Illness:  History of Present Illness:   CHIEF COMPLAINT: Unresponsiveness, AMS OF PRESENT ILLNESS: woman with a history of dementia, COPD on O2, DM, HTN and CAD presents after becoming unresponsive last evening around 730 pm and with AMS today.  [facility] was seen to have syncope and became unresponsive.  EMS was called.  The patient did not act differently or make any complaints leading up to this event.  Patient was taken to the ED where a HCT showed no acute changes or bleed.  Patient was noted to have pinpoint pupils.  Patient was thought to have infection and started on IV antibiotics.  Her blood pressure has been somewhat low since admission.   daughter says that the patient has done much better this Friday afternoon.  She says the patient became responsive again this AM.  Later in the afternoon she became much more responsive.  Since that time her interactive capacity has waxed and waned.   MEDICAL HISTORY:  1. COPD with chronic respiratory failure, on intermittent oxygen at nursing home.   2. Gastroesophageal reflux disease.  History of aspiration pneumonia. Diabetes mellitus, type II, not on insulin. Hypertension. Coronary artery disease with old left bundle branch block as per EKG from December 2012.  Hypothyroidism. Obesity. Chronic history of dementia, wheelchair bound.  Breast cancer. Shingles. Osteopenia. Diffuse myalgias. Paroxysmal atrial fibrillation.  SURGICAL HISTORY:  1. Left hip fracture, status post repair in May 2009. 2. Total abdominal hysterectomy and bilateral salpingo-oophorectomy. Cholecystectomy. Appendectomy.   MEDICATIONS:  1. Zoloft 100 mg p.o. once a day.  2. Tylenol 1 tablet p.o. 2 times a day. Synthroid 75 mcg once a day.  Norvasc 10 mg once a day.  Nexium 40 mg once a day.  Glucophage 500 mg 2 tablets once a day.  Glipizide 5 mg once daily. Ferrous sulfate 325 mg 2 times a day.  Cozaar 100 mg 1 tablet once a day. Ativan 0.5 mg twice a day. Aspirin 81 mg once a day.  Albuterol inhalation on as needed basis.   ALLERGIES: She is allergic to beta-blockers, calcium channel blockers, and codeine.  SOCIAL HISTORY: Former smoker. No history of alcohol or recreational drugs. She lives in a nursing home and has dementia. HISTORY:  Colon cancer and coronary artery disease runs in her family. OF SYSTEMS: Unobtainable as the patient is unresponsive   EXAM GENERAL: Moderate level of memory loss at baseline is noted.  NAD.  Normocephalic and atraumatic. exam without pharmacologic dilation shows normal disc size, appearance and C/D ratio.  There is no papilledema. and S2 sounds are within normal limits, without murmurs, gallops, or rubs. - Normal- NormalDrift - Absent bilaterally.- Deferred due to AMS.  Holds up arms to command.  Spontaneously moves all four extremities during the encounter.   STATUS:is oriented to x 1.  Recent and remote memory are modereately decreased.  Attention span and concentration mildly reduced.  Naming, repetition, comprehension and expressive speech are within normal limits.  Patient's fund of knowledge is within normal limits for educational level. NERVES:   CN II (normal visual acuity and visual fields)   CN III,  IV, VI (extraocular muscles are intact)   CN V (facial sensation is intact bilaterally)   CN VII (facial strength is intact bilaterally)   CN VIII (hearing is intact bilaterally)   CN IX/X (palate elevates midline, normal phonation)   CN XI (shoulder shrug strength is normal and symmetric)   CN XII (tongue protrudes midline) to pain and temp bilaterally (spinothalamic  tracts)to position and vibration bilaterally (dorsal columns)    Biceps   Brachioradialis    Patellar   Achilles to nose testing is within normal limits.  : 81 year old woman with a history of dementia, COPD on O2, DM, HTN and CAD presents after becoming unresponsive last evening around 730 pm and with AMS today.   patient seems to have significantly improved her mental status since last evening.  It is unclear what was the cause but she has had a number of metabolic abnormalities.  In addition she is found to have a UTI with possible urosepsis given the decreased blood pressures.  At this point, I would not recommend additional neurologic assessment at this time unless the AMS returns or stops improving.  If that becomes the case then a routine EEG could be performed.  There are no clear focal abnormalities on exam.  I have viewed the HCT which is unremarkable.   Melrose Nakayama, MD    Past Medical/Surgical Hx:  HTN:   Osteopenia:   Emphysema:   GERD:   CAD:   left hip fracture:   Breast Cancer: Right breast  Shingles:   Hypothyroidism:   Diabetes:   Appendectomy:   Tonsillectomy:   Hysterectomy - Total:   Cholecystectomy:   Home Medications: Medication Instructions Last Modified Date/Time  Cozaar 100 mg oral tablet 1 tab(s) orally once a day (HTN) (830 am) 21-Nov-13 20:40  Synthroid 75 mcg (0.075 mg) oral tablet 1 tab(s) orally once a day on an empty stomach (hypothyroidism) (7 am) 21-Nov-13 20:40  glipiZIDE 5 mg oral tablet 1 tab(s) orally once a day before breakfast for diabetes mellitus (8 am) 21-Nov-13 20:40  Nexium 40 mg oral delayed release capsule 1 cap(s) orally once a day (GERD) (7 am) **do not crush** 21-Nov-13 20:40  Norvasc 10 mg oral tablet 1 tab(s) orally once a day (HTN) (830 am) 21-Nov-13 20:40  aspirin 81 mg oral tablet, chewable 1 tab(s) orally once a day (antiplatelet aggregation) (8 am) 21-Nov-13 20:40  ferrous sulfate 325 mg (65 mg elemental iron) oral tablet 1 tab(s)  orally 2 times a day for iron supplement (8 am, 8 pm) 21-Nov-13 20:40  Tylenol Caplet Extra Strength 500 mg oral tablet 1 tab(s) orally 2 times a day for pain control (830 am, 8 pm) 21-Nov-13 20:40  Glucophage 500 mg oral tablet 2 tab(s) orally once a day (in the morning) and 1 tablet every evening with food for diabetes (830 am, 5 pm) 21-Nov-13 20:40  Zoloft 100 mg oral tablet 1 tab(s) orally once a day (at bedtime) (depression) (8 pm) 21-Nov-13 20:40  Ativan 0.5 mg oral tablet 1 tab(s) orally 2 times a day, As Needed- for Agitation (limit 2 doses per 24 hours) 21-Nov-13 20:40  albuterol 2.5 mg/3 mL (0.083%) inhalation solution Inhale 1 vial (3 milliliters) via nebulizer every 4 to 6 hours as needed for breathing 21-Nov-13 20:40   Allergies:  Codeine: Unknown  Beta Blockers: Unknown  Calcium Channel Blockers: Unknown  Azithromycin: Rash  Vital Signs: **Vital Signs.:   22-Nov-13 13:38   Vital Signs Type Routine   Temperature Temperature (F)  99.4   Celsius 37.4   Temperature Source AdultAxillary   Pulse Pulse 91   Respirations Respirations 19   Systolic BP Systolic BP 751   Diastolic BP (mmHg) Diastolic BP (mmHg) 86   Mean BP 103   Pulse Ox % Pulse Ox % 100   Pulse Ox Activity Level  At rest   Lab Results:  Hepatic:  21-Nov-13 20:59    Bilirubin, Total 0.2   Alkaline Phosphatase 79   SGPT (ALT) 20   SGOT (AST) 22   Total Protein, Serum 7.6   Albumin, Serum 4.0  Routine Micro:  21-Nov-13 20:59    Micro Text Report BLOOD CULTURE   COMMENT                   NO GROWTH IN 8-12 HOURS   ANTIBIOTIC                        Culture Comment NO GROWTH IN 8-12 HOURS  Result(s) reported on 14 Mar 2012 at 07:14AM.  Lab:  21-Nov-13 21:00    pH (ABG)  7.31   PCO2  49   PO2  250   FiO2 100   Base Excess -2.1   HCO3 24.7   O2 Saturation 99.3   O2 Device nrb   Specimen Site (ABG) LT RADIAL   Specimen Type (ABG) ARTERIAL   Patient Temp (ABG) 37.0 (Result(s) reported on 13 Mar 2012  at 09:08PM.)  Routine Chem:  21-Nov-13 20:59    BUN 18   Creatinine (comp) 1.08   Sodium, Serum  133   Potassium, Serum 4.6   Chloride, Serum 101   CO2, Serum 25   Calcium (Total), Serum 9.1   Osmolality (calc) 272   eGFR (African American)  51   eGFR (Non-African American)  44 (eGFR values <54m/min/1.73 m2 may be an indication of chronic kidney disease (CKD). Calculated eGFR is useful in patients with stable renal function. The eGFR calculation will not be reliable in acutely ill patients when serum creatinine is changing rapidly. It is not useful in  patients on dialysis. The eGFR calculation may not be applicable to patients at the low and high extremes of body sizes, pregnant women, and vegetarians.)   Anion Gap 7  22-Nov-13 00:42    Ammonia, Plasma < 25 (Result(s) reported on 14 Mar 2012 at 01:31AM.)    05:03    Cholesterol, Serum 143   Triglycerides, Serum 74   HDL (INHOUSE) 44   VLDL Cholesterol Calculated 15   LDL Cholesterol Calculated 84 (Result(s) reported on 14 Mar 2012 at 06:29AM.)  Urine Drugs:  [id number]   Tricyclic Antidepressant, Ur Qual (comp) NEGATIVE (Result(s) reported on 14 Mar 2012 at 06:38AM.)   Amphetamines, Urine Qual. NEGATIVE   MDMA, Urine Qual. NEGATIVE   Cocaine Metabolite, Urine Qual. NEGATIVE   Opiate, Urine qual NEGATIVE   Phencyclidine, Urine Qual. NEGATIVE   Cannabinoid, Urine Qual. NEGATIVE   Barbiturates, Urine Qual. NEGATIVE   Benzodiazepine, Urine Qual. NEGATIVE (----------------- The URINE DRUG SCREEN provides only a preliminary, unconfirmed analytical test result and should not be used for non-medical  purposes.  Clinical consideration and professional judgment should be  applied to any positive drug screen result due to possible interfering substances.  A more specific alternate chemical method must be used in order to obtain a confirmed analytical result.  Gas chromatography/mass spectrometry (GC/MS) is the  preferred confirmatory method.)   Methadone, Urine Qual.  NEGATIVE  Cardiac:  21-Nov-13 20:59    Troponin I < 0.02 (0.00-0.05 0.05 ng/mL or less: NEGATIVE  Repeat testing in 3-6 hrs  if clinically indicated. >0.05 ng/mL: POTENTIAL  MYOCARDIAL INJURY. Repeat  testing in 3-6 hrs if  clinically indicated. NOTE: An increase or decrease  of 30% or more on serial  testing suggests a  clinically important change)   CK, Total 53   CPK-MB, Serum 1.4 (Result(s) reported on 13 Mar 2012 at 09:52PM.)  22-Nov-13 05:03    Troponin I < 0.02 (0.00-0.05 0.05 ng/mL or less: NEGATIVE  Repeat testing in 3-6 hrs  if clinically indicated. >0.05 ng/mL: POTENTIAL  MYOCARDIAL INJURY. Repeat  testing in 3-6 hrs if  clinically indicated. NOTE: An increase or decrease  of 30% or more on serial  testing suggests a  clinically important change)   CK, Total 41   CPK-MB, Serum 2.0 (Result(s) reported on 14 Mar 2012 at 06:24AM.)  Routine UA:  21-Nov-13 21:00    Color (UA) Yellow   Clarity (UA) Clear   Glucose (UA) Negative   Bilirubin (UA) Negative   Ketones (UA) Negative   Specific Gravity (UA) 1.013   Blood (UA) Negative   pH (UA) 6.0   Protein (UA) Negative   Nitrite (UA) Negative   Leukocyte Esterase (UA) Negative (Result(s) reported on 13 Mar 2012 at 10:19PM.)   RBC (UA) <1 /HPF   WBC (UA) 1 /HPF   Bacteria (UA) NONE SEEN   Epithelial Cells (UA) <1 /HPF   Hyaline Cast (UA) 4 /LPF (Result(s) reported on 13 Mar 2012 at 10:19PM.)  Routine Hem:  21-Nov-13 20:59    WBC (CBC) 7.8   RBC (CBC)  3.66   Hemoglobin (CBC)  10.9   Hematocrit (CBC)  32.9   Platelet Count (CBC) 259   MCV 90   MCH 29.8   MCHC 33.2   RDW  15.4   Neutrophil % 61.5   Lymphocyte % 26.5   Monocyte % 6.5   Eosinophil % 5.0   Basophil % 0.5   Neutrophil # 4.8   Lymphocyte # 2.1   Monocyte # 0.5   Eosinophil # 0.4   Basophil # 0.0 (Result(s) reported on 13 Mar 2012 at 09:32PM.)   Radiology Results: CT:     21-Nov-13 22:37, CT Head Without Contrast   CT Head Without Contrast    REASON FOR EXAM:    unresponsive  COMMENTS:       PROCEDURE: CT  - CT HEAD WITHOUT CONTRAST  - Mar 13 2012 10:37PM     RESULT: Comparison:  None    Technique: Multiple axial images from the foramen magnum to the vertex   were obtained without IV contrast.    Findings:    There is no evidence for mass effect, midline shift, or extra-axial fluid   collections. There is no evidence for space-occupying lesion,   intracranial hemorrhage, or cortical-based area of infarction.   Periventricular and subcortical hypoattenuation is consistent with     chronic small vessel ischemic disease. There is a small air-fluid level   in right maxillary sinus. There is mild mucous thickening of the left   maxillary sinus and the ethmoid air cells.    The osseous structures are unremarkable.    IMPRESSION:    1. No acute intracranial process.  2. Chronic small vessel ischemic disease.  3. Paranasal sinus disease.        Verified By: Gregor Hams, M.D., MD  Electronic Signatures: Anabel Bene (MD)  (Signed [phone] 00:22)  Authored: REFERRING PHYSICIAN, Primary Care Physician, Consult, History of Present Illness, PAST MEDICAL/SURGICAL HISTORY, HOME MEDICATIONS, ALLERGIES, NURSING VITAL SIGNS, LAB RESULTS, RADIOLOGY RESULTS   Last Updated: 23-Nov-13 00:22 by Anabel Bene (MD)

## 2014-08-13 NOTE — Discharge Summary (Signed)
PATIENT NAME:  Amanda Tate, QUAST MR#:  [id number] DATE OF BIRTH:  1914-07-05  DATE OF ADMISSION:  01/06/2013 DATE OF DISCHARGE:  01/09/2013  PRIMARY CARE PHYSICIAN: Corliss Parish, MD  DISCHARGE DIAGNOSES: 1.  Sepsis secondary to pneumonia and urinary tract infection. 2.  Acute hypoxic respiratory failure.  3.  Chronic obstructive pulmonary disease.  4.  Acute renal failure.  5.  Encephalopathy.  6.  Hypoglycemia.  7.  Hypertension.   CONDITION: Stable.   CODE STATUS: DO NOT RESUSCITATE.   HOME MEDICATIONS:  1.  Cozaar 100 mg p.o. daily. 2.  Nexium 40 mg p.o. daily. 3.  Norvasc 10 mg p.o. daily 4.  Aspirin 81 mg p.o. daily. 5.  Ferrous sulfate 325 mg p.o. b.i.d. 6.  Zoloft 100 mg p.o. at bedtime. 7.  Quetiapine 25 mg 1/2 tablet once a day.  8.  Synthroid 137 mcg p.o. daily.  9.  Estrace vaginal  0.1 mg/g cream use as directed. 10.  Ativan 1 ml topical to affected area twice a day p.r.n.  11.  Seroquel 25 mg p.o. at bedtime.  12.  Albuterol 2.5 mg/3 mL inhalation solution 3 mL inhaled p.r.n.  13.  Augmentin 875 mg/125 mg p.o. q. 12 hours for 7 days.   DIET: Low sodium diet.   ACTIVITY: As tolerated.  FOLLOW-UP CARE: Follow with PCP within 1 to 2 weeks.   REASON FOR ADMISSION: Cough and low-grade fever for 1 day associated with increased confusion and decreased mental status   HOSPITAL COURSE: The patient is a 81 year old Caucasian female from a nursing home with a history of dementia, COPD, diabetes and CAD.  Was sent from nursing home due to cough and fever. The patient was also noted to have decreased mental status so the patient was sent to the ED for further evaluation. The patient was noted to have hypoglycemia with blood sugar in 40s. The patient was treated with D50 in the ED. In addition, the patient was noted to have a left lower lobe pneumonia on chest x-ray. For detailed history and physical examination, please refer to the admission note dictated by Dr. Clint Guy. Laboratory  data on admission date showed sodium 139, potassium 4.4, chloride 106, bicarb 27, BUN 39 and creatinine 1.56. The patient's WBC was 6.1 and hemoglobin 11.3. Urinalysis showed WBC 25 suggesting UTI. Lactic acid was 2.9.  1.  Sepsis secondary to pneumonia and UTI.  After admission, the patient has been treated with vancomycin and cefepime. In addition, the patient was also treated with nebulizer. The patient's symptoms have much improved, but the patient is still demented. The patient's urine culture showed UTI with Escherichia coli so vancomycin and cefepime was discontinued and changed to Rocephin. The patient needs to continue Augmentin after discharge back to nursing facility. Urine legionella Ag is positive, pt is allergic to zithromax, will add levaquin 500 mg daily for 7 days. 2.  For acute hypoxic respiratory failure, after above-mentioned treatment, the patient's symptoms have much improved. O2 saturation is normal.  3.  Encephalopathy, possibly due to sepsis secondary to pneumonia, UTI and hypoglycemia. The patient's mental status looks like back to her baseline, but she is still demented. We held pain medication after discharge. The patient needs fall aspiration precautions.  4.  Acute renal failure. The patient has been treated with IV fluid support. BUN and creatinine decreased to normal range.  5.  For hypoglycemia, the patient's diabetes medication was on hold. She was treated with D5 normal saline. Hypoglycemia has  improved. We will hold glipizide and metformin after discharge and monitor blood sugar closely in the nursing home.  6.  For hypertension, since the patient's blood pressure was really low, we held hypertension medication, but since the patient's blood pressure has been elevated, we will resume the patient's blood pressure medication.   The patient has much improved. Her vital signs are stable. She will be discharged back to nursing facility today. I discussed the patient's discharge  plan with the patient's nurse.   TIME SPENT: About 38 minutes.  ____________________________ Shaune PollackQing Wataru Mccowen, MD qc:sb D: 01/09/2013 12:57:16 ET T: 01/09/2013 13:14:17 ET JOB#: [id number]  cc: Shaune PollackQing Josejuan Hoaglin, MD, <Dictator> Shaune PollackQING Jaeley Wiker MD ELECTRONICALLY SIGNED 01/09/2013 17:22

## 2014-08-13 NOTE — H&P (Signed)
PATIENT NAME:  Amanda Tate, Amanda Tate MR#:  [id number] DATE OF BIRTH:  09-10-1914  DATE OF ADMISSION:  01/06/2013  PRIMARY CARE PHYSICIAN:  Dr. Alexandria Lodge.  REFERRING PHYSICIAN:  Dr. Darl Householder.  HISTORY OF PRESENT ILLNESS:  Ms. Amanda Tate is a 81 year old Caucasian female who is a nursing home resident with past medical history of dementia, COPD, non-O2-dependent, type 2 diabetes, coronary artery disease, GERD, paroxysmal atrial fibrillation, who is presenting from a nursing home with decreased mental status for two day duration.  According to the daughter at bedside who was giving further details states that her mother was having a cough and low-grade fever for one day duration with associated increased confusion and decreased mental status.  She does note positive sick contacts over the last 1 to 2 weeks with URI-like symptoms.  This information was brought into the nursing home attention.  The original plan was to wait until tomorrow to have laboratory data as well as chest x-rays performed at the facility though after discussion between the daughter and nursing home staff thought that because of her mother's decreased mental status present to [facility] for further work-up and evaluation.  On EMS arrival the patient herself was found to be hypoglycemic at 40.  She was given a half amp of D50 in the Emergency Department.  She was found to be hypothermic at 95.8 degrees Fahrenheit as well as left lower lobe pneumonia on work-up.  Currently, the patient is unable to provide further information secondary to mental status.   REVIEW OF SYSTEMS:  Unable to obtain secondary to patient's mental status.   PAST MEDICAL HISTORY:  COPD without oxygen therapy, gastroesophageal reflux disease, type 2 diabetes, hypertension, coronary artery disease with history of left bundle branch block, hypothyroidism, dementia and wheelchair-bound, history of breast cancer approximately 16 years ago, did not require radiation or chemo as well as a  history of paroxysmal atrial fibrillation.   FAMILY HISTORY:  Significant for diabetes, coronary artery disease as well as colon cancer in multiple members of the family.   SOCIAL HISTORY:  Daughter denies any previous alcohol, tobacco or drug usage.  She is currently a nursing home resident who is wheelchair-bound.  At baseline she is confused, usually not oriented.  She would state things such as she was talking to her mother who has been dead for over 84 some years.  She is fully dependent for activities of daily living including eating and dressing.   ALLERGIES:  AZITHROMYCIN, BETA-BLOCKERS, CALCIUM CHANNEL BLOCKERS AND CODEINE.  PHYSICAL EXAMINATION: VITAL SIGNS:  On arrival temperature 95.8, pulse 65, respirations 20, blood pressure 114/49, saturating 87% on room air.  Since then she is now 99% on supplemental O2, weight 77.3 kg, BMI 33.3.  GENERAL:  Currently she responds by opening her eyes to verbal stimuli, though unable to further interact.   HEENT:  Normocephalic, atraumatic.  Extraocular muscles intact.  Pupils equal, round, react to light as well as accommodation.  No nasal lesions.  No drainage.  Ears, no drainage or lesions.  Mouth, dry mucosal membranes.  NECK:  Supple and symmetric.  No thyromegaly.  CARDIOVASCULAR:  S1, S2, regular rate and rhythm with a 3/6 systolic ejection murmur heard throughout the chest, most prominent at right upper sternal border.  RESPIRATORY:  Diffuse scattered rhonchi with decreased breath sounds in the left lower side.  ABDOMEN:  Soft, nontender, nondistended with positive bowel sounds.  EXTREMITIES:  Reveal no cyanosis, edema or clubbing.  SKIN:  Inspection within normal limits  and no diaphoresis or obvious wounds. LYMPHATIC:  No lymphadenopathy.  NEUROLOGIC:  Unable to assess secondary to patient's baseline mental status.  PSYCHIATRIC:  Unable to assess secondary to patient's current mental status.   LABORATORY DATA:  Sodium 139, potassium 4.4,  chloride 106, bicarb 27, BUN 39, creatinine 1.56 with a last known creatinine of 0.76 back in November of 2013, glucose 119, total protein 6.1, albumin 3, bilirubin 0.3, alk phos 78, AST 23, ALT 21.  WBC 6.1, hemoglobin 11.3, platelets 191.  Urinalysis 1+ leukocyte esterase, negative for nitrates, 3+ bacteria, 25 WBCs, epithelial cells of 3, lactic acid of 2.9.  EKG, normal sinus rhythm with left bundle branch.  Chest x-ray, left lower lobe effusion with interstitial infiltrate that is new from previous x-ray.   ASSESSMENT AND PLAN:  A 81 year old Caucasian female with history of dementia, chronic obstructive pulmonary disease, diabetes, paroxysmal atrial fibrillation, who is presenting from a nursing home for decreased mental status of two day duration with associated low grade fever and cough.  The cough is nonproductive in nature.  She was found to be hypoglycemic as well as have a left lower lobe pneumonia.  1.  Sepsis secondary to pulmonary source, healthcare-associated pneumonia.  Panculture including urine, blood, sputum.  Broad-spectrum antibiotic coverage with vancomycin and cefepime for healthcare-associated pneumonia coverage as she is a nursing home resident, supplemental O2 to keep SaO2 greater than 92%.  IV fluid hydration to keep map greater than 65.  Check strep and Legionella urinary antigens, provide DuoNeb treatments q. 4 hours.  2.  Acute hypoxemic respiratory insufficiency secondary to #1.  Treatment course as above.  3.  Encephalopathy secondary to #1.  Avoid further sedating agents if possible, caution with any further pain medications.  4.  Acute kidney injury secondary to septic state.  Continue with IV fluid hydration.  5.  Hypoglycemia.  Hold all diabetic agents.  Repeat Accu-Chek q. 2 hours until stable.  6.  Type 2 diabetes.  Hold all agents as patient is hypoglycemic.  7.  Hypertension.  Hold all agents.  The patient is relatively hypotensive.  8.  Hypothyroidism.  Check TSH  and continue Synthroid.  9.  Depression.  Continue with Zoloft for the time being.  10.  CODE STATUS:  THE PATIENT IS A DO NOT RESUSCITATE per documentation from nursing facility confirmed by daughter at bedside.   Total critical care time spent 45 minutes.    ____________________________ Aaron Mose. Hower, MD dkh:ea D: 01/06/2013 23:01:00 ET T: 01/06/2013 23:24:42 ET JOB#: [id number]  cc: Aaron Mose. Hower, MD, <Dictator> DAVID Woodfin Ganja MD ELECTRONICALLY SIGNED 01/07/2013 0:11

## 2020-07-26 ENCOUNTER — Encounter: Payer: Self-pay | Admitting: Adult Health

## 2020-07-26 NOTE — Progress Notes (Signed)
This encounter was created in error - please disregard.
